# Patient Record
Sex: Female | Born: 1995 | Race: White | Hispanic: No | Marital: Single | Smoking: Light tobacco smoker
Health system: Southern US, Community
[De-identification: ages and names within clinical notes are randomized; demographics above are authoritative.]

---
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
## 2016-02-10 ENCOUNTER — Telehealth (HOSPITAL_COMMUNITY): Payer: Self-pay | Admitting: *Deleted

## 2016-02-10 NOTE — Telephone Encounter (Signed)
Prior authorization for Adderall XR received. Called [facility] spoke with Danny who approved until 03/11/17 [id number]. Called to notify pharmacy.

## 2016-02-24 ENCOUNTER — Encounter (HOSPITAL_COMMUNITY): Payer: Self-pay | Admitting: Psychiatry

## 2016-02-24 ENCOUNTER — Ambulatory Visit (INDEPENDENT_AMBULATORY_CARE_PROVIDER_SITE_OTHER): Payer: 59 | Admitting: Psychiatry

## 2016-02-24 VITALS — BP 107/80 | HR 78 | Wt 135.0 lb

## 2016-02-24 DIAGNOSIS — F331 Major depressive disorder, recurrent, moderate: Secondary | ICD-10-CM

## 2016-02-24 DIAGNOSIS — F900 Attention-deficit hyperactivity disorder, predominantly inattentive type: Secondary | ICD-10-CM | POA: Diagnosis not present

## 2016-02-24 DIAGNOSIS — F411 Generalized anxiety disorder: Secondary | ICD-10-CM | POA: Diagnosis not present

## 2016-02-24 MED ORDER — ESCITALOPRAM OXALATE 10 MG PO TABS: 15.0000 mg | ORAL_TABLET | Freq: Every day | ORAL | 0 refills | Status: DC

## 2016-02-24 MED ORDER — AMPHETAMINE-DEXTROAMPHET ER 5 MG PO CP24: 5.0000 mg | ORAL_CAPSULE | Freq: Every day | ORAL | 0 refills | Status: DC

## 2016-02-24 NOTE — Progress Notes (Signed)
Patient ID: Brenda Barnes, female   DOB: 08/22/1995, 20 y.o.   MRN: [id number] [facility] Outpatient Follow up visit  Patient Identification: Brenda Husted MRN:  [id number] Date of Evaluation:  02/24/2016 Referral Source: Alonna Buckler, Primary care Chief Complaint:   Chief Complaint    Follow-up     Visit Diagnosis:    ICD-9-CM ICD-10-CM   1. Moderate episode of recurrent major depressive disorder (HCC) 296.32 F33.1   2. GAD (generalized anxiety disorder) 300.02 F41.1   3. 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 Acne [L70.9] 06/07/2015  . Allergic rhinitis [J30.9] 06/07/2015  . Moderate episode of recurrent major depressive disorder (HCC) [F33.1] 06/07/2015  . Anxiety disorder [F41.9] 06/07/2015   History of Present Illness:  20 years old currently single Caucasian female lives with her mom and stepdad currently , referred initially for inattention and possible anxiety.  Patient was started on Lexapro for possible anxiety generalized anxiety and depression.  Her depression has gone somewhat worse since she moved to [place]. She has 2 room mates and they are messy.  She is adjusting but feeling low, decreased energy. Also working and a Consulting civil engineer. Somewhat anxious.  Not too much worried about inattention. adderall helps some   No side effects reported. Denies palpitations.  No pyschotic symptoms.  Modifying factor: family Aggravating factor: inattention. Room mates. School stress  Location: anxiety, inattention. Anxiety, dysphoria at times, inattention Context: multi tasking  Duration: on and off greater then 5 years   Associated Signs/Symptoms: Depression Symptoms:  difficulty concentrating, anxiety, (Hypo) Manic Symptoms:  Distractibility, Anxiety Symptoms:  Excessive Worry,  Psychotic Symptoms:  denies PTSD Symptoms: NA     Past Medical History:  History reviewed. No pertinent past medical history. History reviewed. No pertinent surgical history. Family History: History reviewed. No pertinent family history. Social History:   Social History   Social History  . Marital status: Single    Spouse name: N/A  . Number of children: N/A  . Years of education: N/A   Social History Main Topics  . Smoking status: Light Tobacco Smoker    Packs/day: 0.50    Types: Cigarettes  . Smokeless tobacco: Never Used  . Alcohol use 0.0 oz/week     Comment: 5 q wk  . Drug use:     Frequency: 1.0 time per week    Types: Marijuana  . Sexual activity: Yes    Partners: Male   Other Topics Concern  . None   Social History Narrative  . None     Musculoskeletal: Strength & Muscle Tone: within normal limits Gait & Station: normal Patient leans: N/A  Psychiatric Specialty Exam: HPI  Review of Systems  Constitutional: Negative for fever.  Cardiovascular: Negative for chest pain.  Skin: Negative for rash.  Neurological: Negative for tingling and tremors.  Psychiatric/Behavioral: Positive for depression. Negative for hallucinations, substance abuse and suicidal ideas.    Blood pressure 107/80, pulse 78, weight 135 lb (61.2 kg).Body mass index is 23.91 kg/m.  General Appearance: Casual  Eye Contact:  Fair  Speech:  Normal Rate  Volume:  Normal  Mood:  Somewhat dysphoric  Affect:  sad  Thought Process:  Coherent  Orientation:  Full (Time, Place, and Person)  Thought Content:  Rumination  Suicidal Thoughts:  No  Homicidal Thoughts:  No  Memory:  Immediate;   Fair Recent;   Fair  Judgement:  Fair  Insight:  Fair  Psychomotor Activity:  Normal  Concentration:  Fair  Recall:  [employer]Fair  Fund of Knowledge:Fair  Language: Fair  Akathisia:  Negative  Handed:  Right  AIMS (if indicated):    Assets:  Desire for Improvement Social Support  ADL's:  Intact  Cognition: WNL  Sleep:  fair   Is the patient at risk to self?  No. Has the  patient been a risk to self in the past 6 months?  No. Has the patient been a risk to self within the distant past?  No.  Allergies:  No Known Allergies Current Medications: Current Outpatient Prescriptions  Medication Sig Dispense Refill  . amphetamine-dextroamphetamine (ADDERALL XR) 5 MG 24 hr capsule Take 1 capsule (5 mg total) by mouth daily. 30 capsule 0  . escitalopram (LEXAPRO) 10 MG tablet Take 1.5 tablets (15 mg total) by mouth daily. 45 tablet 0  . Etonogestrel (IMPLANON [place]) Inject into the skin.     No current facility-administered medications for this visit.       Treatment Plan Summary: Medication management and Plan as follows  WUJ:WJXBJYNWGAD:continue lexapro increase to 15mg  qd for anxiety and depression.   ADD:  continue 5mg  adderall for now  She is again advised to abstain from marijuana or any other substances Depression: increase lexapro as above.  Reviewed sleep hygiene. More than 50% time spent in counseling and coordination of care including patient education Call 911 or report (is here when he was in concerns or suicidal thoughts. Medication side effects reviewed Follow-up in 1 month but she wants to come back 2 months. Will call back in a month for refill as well. If need to increase lexapro to 20mg  patient will call back as well. Time spent; 25 minutes   Beverlyn Mcginness 10/6/201710:25 AM

## 2016-03-27 ENCOUNTER — Telehealth (HOSPITAL_COMMUNITY): Payer: Self-pay | Admitting: Psychiatry

## 2016-03-27 ENCOUNTER — Other Ambulatory Visit (HOSPITAL_COMMUNITY): Payer: Self-pay | Admitting: *Deleted

## 2016-03-27 MED ORDER — ESCITALOPRAM OXALATE 10 MG PO TABS: 20.0000 mg | ORAL_TABLET | Freq: Every day | ORAL | 0 refills | Status: DC

## 2016-03-27 NOTE — Telephone Encounter (Signed)
Medication refill- pt phoned into office requesting a refill for Lexapro. Per Dr. Gilmore LarocheAkhtar, refill authorized for Lexapro 10mg , #60. Prescription has been sent to pharmacy. Pt f/u apt is schedule for 12/12. lvm informing pt of refill status.

## 2016-03-27 NOTE — Telephone Encounter (Signed)
Per Dr. Gilmore LarocheAkhtar, prescription has been sent to pharmacy. lvm informing pt of refill status.

## 2016-03-27 NOTE — Telephone Encounter (Signed)
Pt was told to call when needed a refill on lexapro.  Pt is almost out. She also stated that dr Gilmore Larocheakhtar was going to increase this rx to 20mg , and they talked about this in the last ov.   Please advise  cb at [phone]

## 2016-05-01 ENCOUNTER — Ambulatory Visit (INDEPENDENT_AMBULATORY_CARE_PROVIDER_SITE_OTHER): Payer: 59 | Admitting: Psychiatry

## 2016-05-01 ENCOUNTER — Encounter (HOSPITAL_COMMUNITY): Payer: Self-pay | Admitting: Psychiatry

## 2016-05-01 VITALS — BP 116/72 | HR 79 | Resp 16 | Ht 63.0 in | Wt 132.0 lb

## 2016-05-01 DIAGNOSIS — F411 Generalized anxiety disorder: Secondary | ICD-10-CM | POA: Diagnosis not present

## 2016-05-01 DIAGNOSIS — F17210 Nicotine dependence, cigarettes, uncomplicated: Secondary | ICD-10-CM | POA: Diagnosis not present

## 2016-05-01 DIAGNOSIS — F331 Major depressive disorder, recurrent, moderate: Secondary | ICD-10-CM

## 2016-05-01 DIAGNOSIS — F900 Attention-deficit hyperactivity disorder, predominantly inattentive type: Secondary | ICD-10-CM

## 2016-05-01 DIAGNOSIS — Z79899 Other long term (current) drug therapy: Secondary | ICD-10-CM

## 2016-05-01 MED ORDER — ESCITALOPRAM OXALATE 20 MG PO TABS
30.0000 mg | ORAL_TABLET | Freq: Every day | ORAL | 1 refills | Status: AC
Start: 2016-05-01 — End: ?

## 2016-05-01 MED ORDER — BUSPIRONE HCL 7.5 MG PO TABS: 7.5000 mg | ORAL_TABLET | Freq: Every day | ORAL | 0 refills | Status: DC

## 2016-05-01 MED ORDER — AMPHETAMINE-DEXTROAMPHET ER 5 MG PO CP24: 5.0000 mg | ORAL_CAPSULE | Freq: Every day | ORAL | 0 refills | Status: DC

## 2016-05-01 NOTE — Progress Notes (Signed)
Patient ID: Brenda Barnes, female   DOB: 05/25/1995, 20 y.o.   MRN: [id number] [facility] Outpatient Follow up visit  Patient Identification: Brenda Barnes MRN:  [id number] Date of Evaluation:  05/01/2016 Referral Source: Alonna BucklerMarla Martin, Primary care Chief Complaint:   Chief Complaint    Follow-up     Visit Diagnosis:    ICD-9-CM ICD-10-CM   1. Moderate episode of recurrent major depressive disorder (HCC) 296.32 F33.1   2. GAD (generalized anxiety disorder) 300.02 F41.1   3. 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 Acne [L70.9] 06/07/2015  . Allergic rhinitis [J30.9] 06/07/2015  . Moderate episode of recurrent major depressive disorder (HCC) [F33.1] 06/07/2015  . Anxiety disorder [F41.9] 06/07/2015   History of Present Illness:  20 years old currently single Caucasian female lives with her mom and stepdad currently , referred initially for inattention and possible anxiety.  Patient has been feeling more depressed depression worsened because of recent breakup and school stress. Anxiety is also somewhat worsened. She just finished her semester overall she continues to do therapy but feeling depressed not hopeless or suicidal No pyschotic symptoms.  Modifying factor:family  Aggravating factor: recent break up: inattention. Room mates. School stress  Location: anxiety, inattention. Anxiety, dysphoria at times, inattention s      Past Medical History: History reviewed. No pertinent past medical history. History reviewed. No pertinent surgical history. Family History: History reviewed. No pertinent family history. Social History:   Social History   Social History  . Marital status: Single    Spouse name: N/A  . Number of children: N/A  . Years of education: N/A   Social History Main Topics  . Smoking status: Light Tobacco Smoker    Packs/day: 0.50    Types:  Cigarettes  . Smokeless tobacco: Never Used  . Alcohol use 3.6 - 6.0 oz/week    6 - 10 Standard drinks or equivalent per week     Comment: 5 q wk  . Drug use:     Frequency: 1.0 time per week    Types: Marijuana  . Sexual activity: Yes    Partners: Male    Birth control/ protection: Implant   Other Topics Concern  . None   Social History Narrative  . None       Psychiatric Specialty Exam: HPI  Review of Systems  Constitutional: Negative for fever.  Cardiovascular: Negative for palpitations.  Skin: Negative for rash.  Neurological: Negative for tingling and tremors.  Psychiatric/Behavioral: Positive for depression. Negative for hallucinations and substance abuse. The patient is nervous/anxious.     Blood pressure 116/72, pulse 79, resp. rate 16, height 5\' 3"  (1.6 m), weight 132 lb (59.9 kg), SpO2 99 %.Body mass index is 23.38 kg/m.  General Appearance: Casual  Eye Contact:  Fair  Speech:  Normal Rate  Volume:  Normal  Mood:  depressed  Affect:  sad  Thought Process:  Coherent  Orientation:  Full (Time, Place, and Person)  Thought Content:  Rumination  Suicidal Thoughts:  No  Homicidal Thoughts:  No  Memory:  Immediate;   Fair Recent;   Fair  Judgement:  Fair  Insight:  Fair  Psychomotor Activity:  Normal  Concentration:  Fair  Recall:  F[employer]ervFair  Fund of Knowledge:Fair  Language: Fair  Akathisia:  Negative  Handed:  Right  AIMS (if indicated):    Assets:  Desire for Improvement Social  Support  ADL's:  Intact  Cognition: WNL  Sleep:  fair    Allergies:  No Known Allergies Current Medications: Current Outpatient Prescriptions  Medication Sig Dispense Refill  . amphetamine-dextroamphetamine (ADDERALL XR) 5 MG 24 hr capsule Take 1 capsule (5 mg total) by mouth daily. 30 capsule 0  . escitalopram (LEXAPRO) 20 MG tablet Take 1.5 tablets (30 mg total) by mouth daily. 45 tablet 1  . Etonogestrel (IMPLANON [place]) Inject into the skin.    . busPIRone (BUSPAR) 7.5 MG  tablet Take 1 tablet (7.5 mg total) by mouth daily. 30 tablet 0   No current facility-administered medications for this visit.       Treatment Plan Summary: Medication management and Plan as follows   1. MDD: worsened. Moderate. Increase lexapro to 30mg . She has been taking 20mg  2. GAD: somewhat worsened. Increase lexapro 30mg  ADD: baselineL continue adderall 5mg    Reviewed medication side effects concerns and questions were answered follow-up in 2 months or earlier if needed she can come in early she states that she cannot want to come in before 2 months   Brandom Kerwin 12/12/201710:51 AM

## 2016-05-11 ENCOUNTER — Telehealth (HOSPITAL_COMMUNITY): Payer: Self-pay | Admitting: *Deleted

## 2016-05-11 NOTE — Telephone Encounter (Signed)
That would be ok and call back for concerns

## 2016-05-11 NOTE — Telephone Encounter (Signed)
Med management- pt states since she increase Lexapro to 30mg  she is having stomach pains, loss of appetite. Pt would like to decrease Lexapro to previous dose of 20mg . Please advise.

## 2016-05-11 NOTE — Telephone Encounter (Signed)
Return call to pt. Per Dr. Gilmore LarocheAkhtar, please inform pt she may decease Lexapro back to 20mg  daily. Pt was informed if there are any concerns, she may contact office for an earlier appt. Pt verbalizes understanding.

## 2016-06-08 ENCOUNTER — Other Ambulatory Visit (HOSPITAL_COMMUNITY): Payer: Self-pay | Admitting: *Deleted

## 2016-06-08 ENCOUNTER — Telehealth (HOSPITAL_COMMUNITY): Payer: Self-pay | Admitting: *Deleted

## 2016-06-08 MED ORDER — AMPHETAMINE-DEXTROAMPHET ER 5 MG PO CP24
5.0000 mg | ORAL_CAPSULE | Freq: Every day | ORAL | 0 refills | Status: AC
Start: 2016-06-08 — End: ?

## 2016-06-08 MED ORDER — BUSPIRONE HCL 7.5 MG PO TABS: 7.5000 mg | ORAL_TABLET | Freq: Every day | ORAL | 0 refills | Status: AC

## 2016-06-08 NOTE — Telephone Encounter (Signed)
Medication refill- pt contact office requesting a refill for Adderall and Buspar. Per Dr. Gilmore LarocheAkhtar, refill is authorize for Adderall 5 mg, #30 and Buspar 7.5mg , #30. [facility] [phone]. Buspar rx was sent electronically to pharmacy. Called and informed pt of refill status. Pt 's next apt is schedule on 06/29/16. Pt verbalizes understanding.

## 2016-06-08 NOTE — Telephone Encounter (Signed)
adderal printed .buspar can be sent

## 2016-06-08 NOTE — Telephone Encounter (Signed)
Per Dr. Gilmore LarocheAkhtar, buspar [facility]

## 2016-06-08 NOTE — Telephone Encounter (Signed)
adderall printed for pick up 

## 2016-06-08 NOTE — Telephone Encounter (Signed)
Medication refill- pt phoned office requesting a refill for Adderall and Buspar. Refills were last filled on 05/01/16. [facility] Pt's next apt is schedule on 06/29/16.  Please advise.

## 2016-06-29 ENCOUNTER — Ambulatory Visit (HOSPITAL_COMMUNITY): Payer: Self-pay | Admitting: Psychiatry

## 2016-07-16 ENCOUNTER — Telehealth (HOSPITAL_COMMUNITY): Payer: Self-pay | Admitting: *Deleted

## 2016-07-16 NOTE — Telephone Encounter (Signed)
Pt lvm requesting a refill for Buspar and Lexapro. Pt was last seen 04/2016. Lvm for pt to contact office to schedule an apt. Would you like to send refills? Please advise.

## 2016-07-16 NOTE — Telephone Encounter (Signed)
If this is first request for refill be ok. He has to schedule as well.

## 2016-07-16 NOTE — Telephone Encounter (Signed)
Pt return call to office. Informed pt, per Dr. Gilmore LarocheAkhtar, pt will need to schedule an apt.  Refills were issued for Buspar and Adderall on 06/08/16. Pt cancel apt on 06/29/16. Offered pt apt, pt decline. Pt states she will need to check when her next break from college and call the office back to schedule an apt.
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
